# Patient Record
Sex: Female | Born: 1960 | Race: White | Hispanic: No | Marital: Married
Health system: Southern US, Community
[De-identification: ages and names within clinical notes are randomized; demographics above are authoritative.]

---

## 2009-08-10 ENCOUNTER — Ambulatory Visit: Payer: Self-pay | Admitting: Otolaryngology

## 2009-09-17 ENCOUNTER — Ambulatory Visit: Payer: Self-pay | Admitting: Internal Medicine

## 2010-12-06 IMAGING — US US THYROID
1 series · 17 of 22 positions shown · non-contrast
Comparison: none

REASON FOR EXAM: elevated TSH  Hx radiation to neck
COMMENTS:

[Series 1: us thyroid · 17 of 22 slices shown]
[im 1/22]
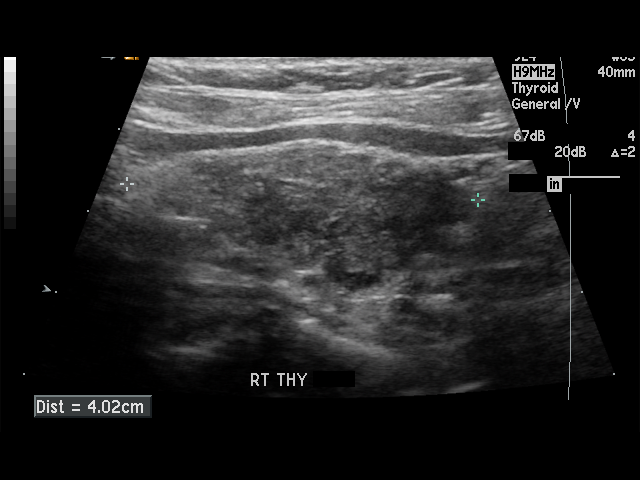
[im 2/22]
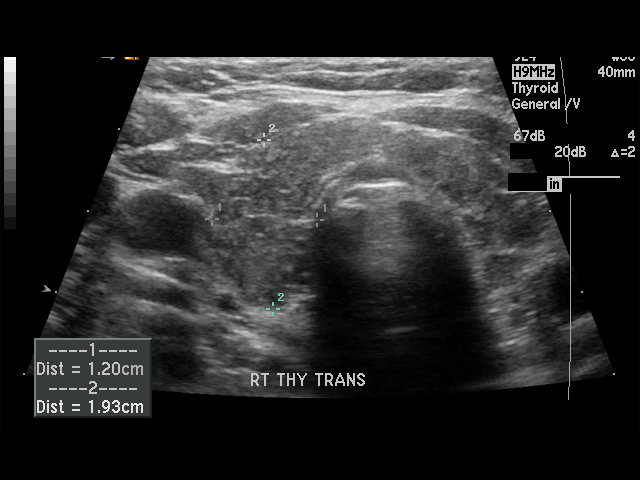
[im 4/22]
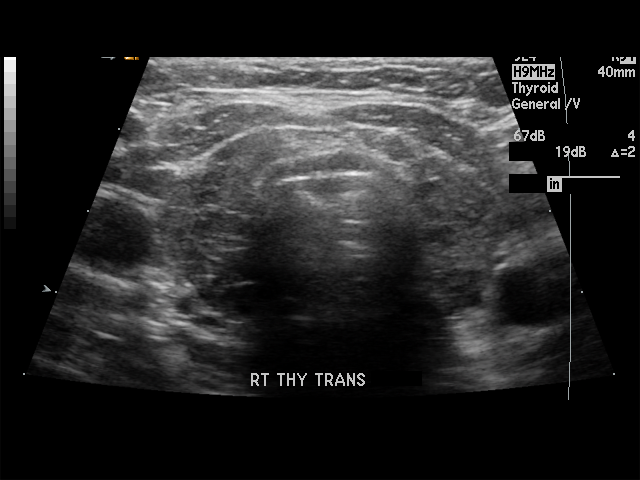
[im 5/22]
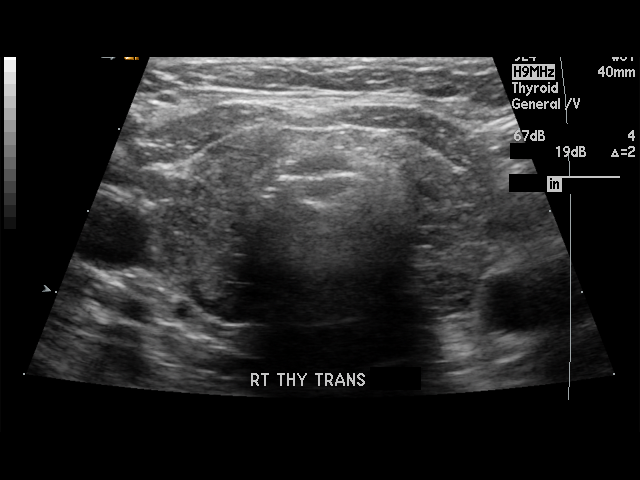
[im 6/22]
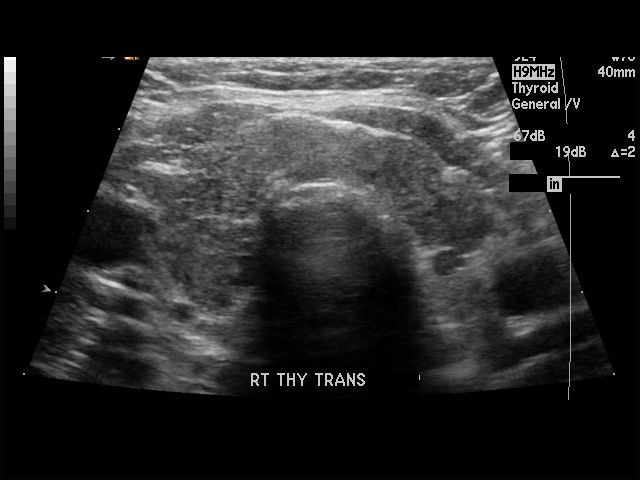
[im 8/22]
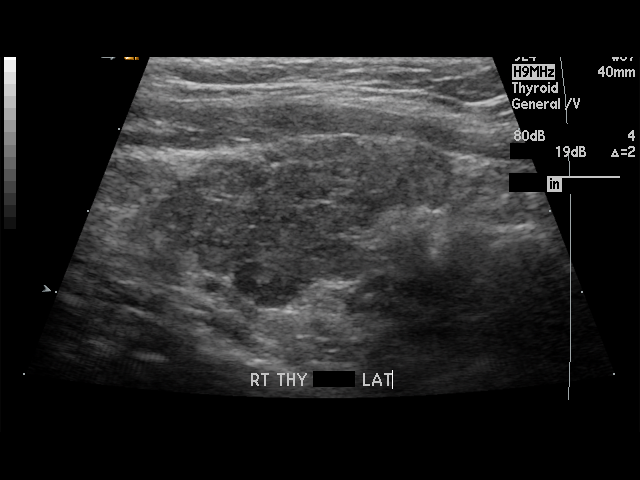
[im 9/22]
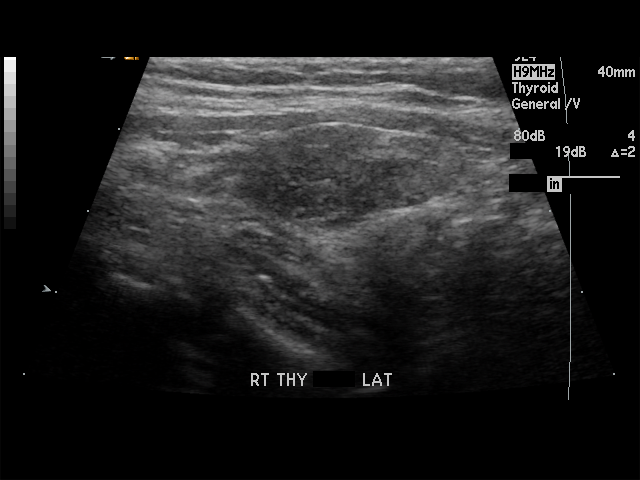
[im 10/22]
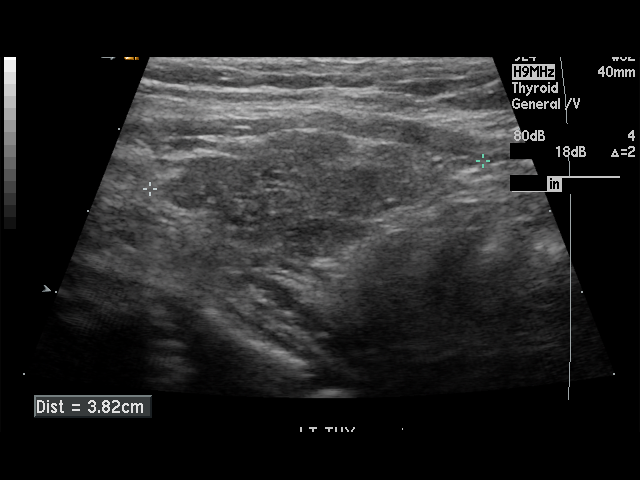
[im 12/22]
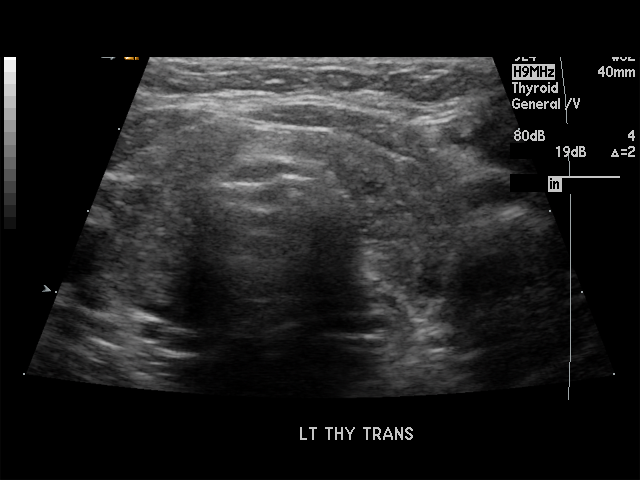
[im 13/22]
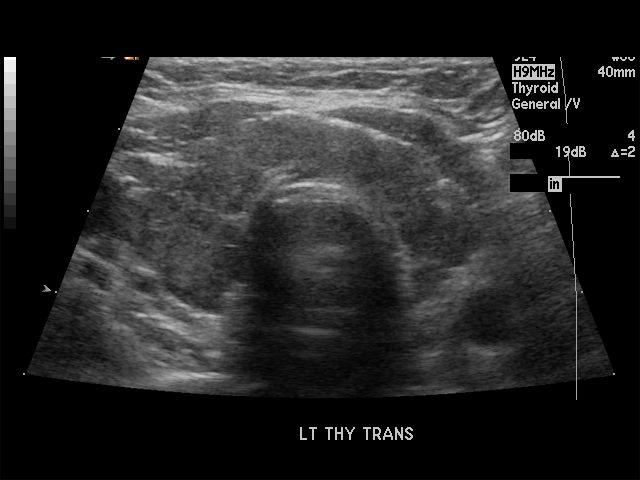
[im 14/22]
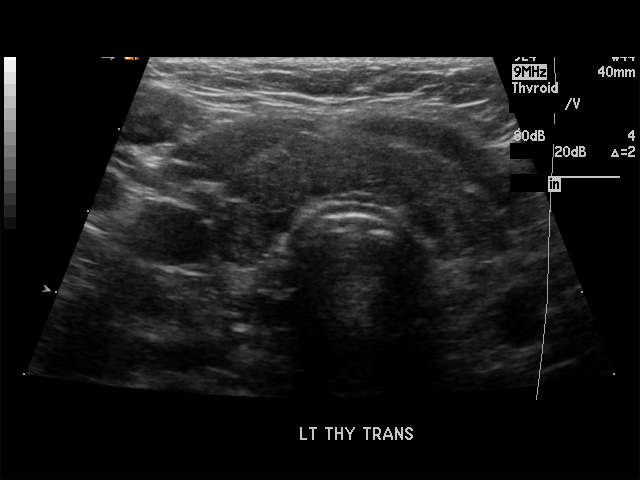
[im 15/22]
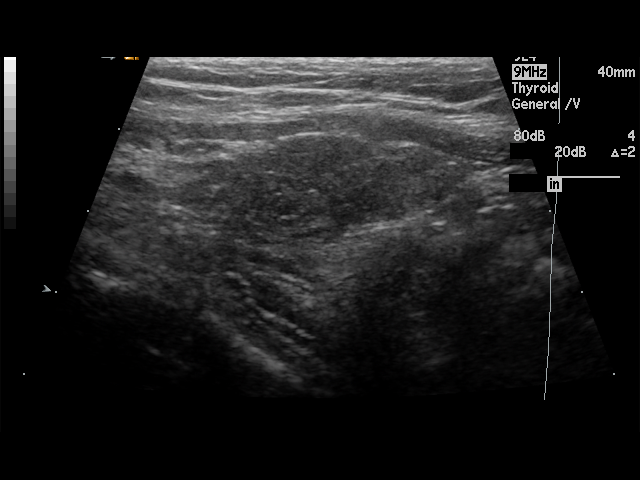
[im 17/22]
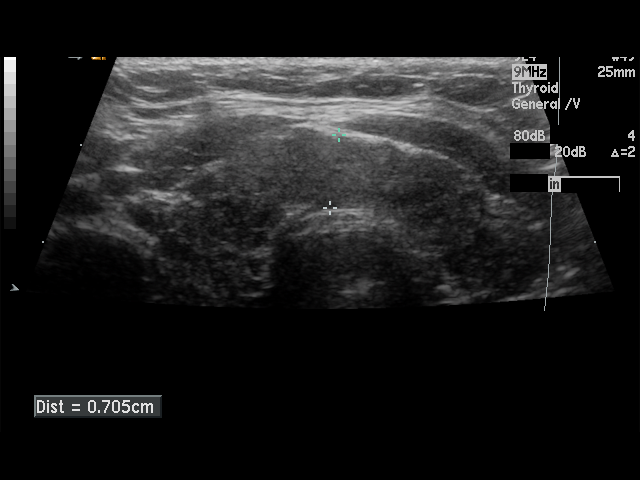
[im 18/22]
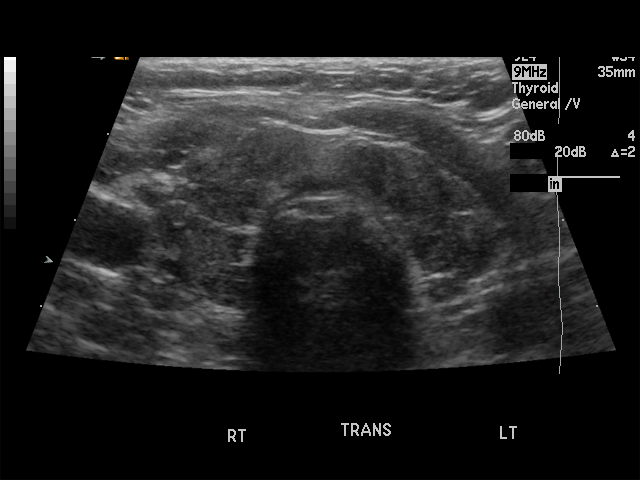
[im 19/22]
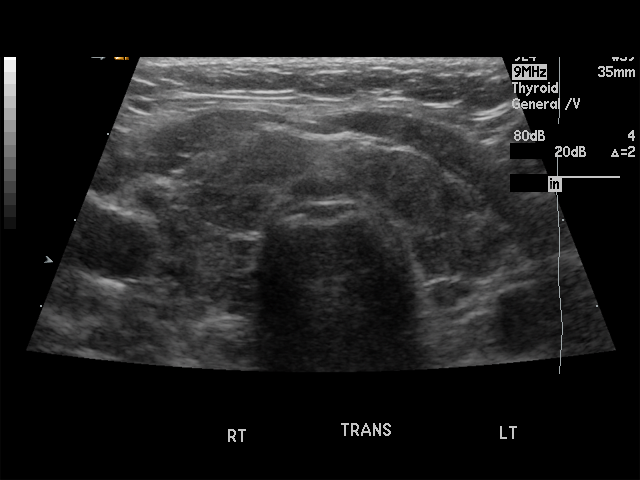
[im 21/22]
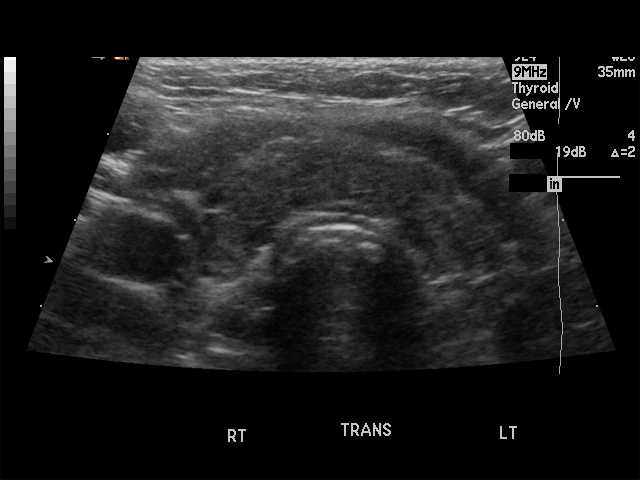
[im 22/22]
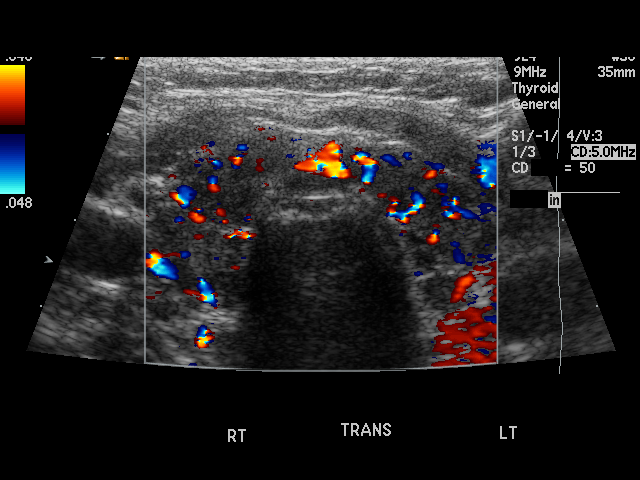

[17 of 22 positions shown; findings below may reference images not displayed]

PROCEDURE:     US  - US THYROID  - September 17, 2009  [DATE]

RESULT:     The right lobe of the thyroid measures 4 cm x 1.2 cm x 1.9 cm
and the left lobe measures 3.8 cm x 1.46 cm x 1.78 cm. No defined thyroid
masses or nodules are seen. The thyroid echotexture bilaterally is diffusely
heterogeneous which is a nonspecific finding. This appearance is frequently
seen as a sequela of thyroiditis. No thyroid calcifications are seen.
IMPRESSION: 1. The thyroid lobes are within the limits of normal for size.
2. The thyroid echotexture is diffusely nonhomogeneous.
3. No defined thyroid masses or nodules are seen.

## 2014-05-06 DEATH — deceased

## 2021-05-28 ENCOUNTER — Other Ambulatory Visit (INDEPENDENT_AMBULATORY_CARE_PROVIDER_SITE_OTHER): Payer: Self-pay

## 2021-10-03 ENCOUNTER — Telehealth: Payer: Self-pay | Admitting: Radiology

## 2021-10-03 NOTE — Telephone Encounter (Signed)
Error
